# Patient Record
Sex: Male | Born: 1970 | Race: White | Hispanic: No | Marital: Married | Smoking: Current every day smoker
Health system: Southern US, Community
[De-identification: ages and names within clinical notes are randomized; demographics above are authoritative.]

## PROBLEM LIST (undated history)

## (undated) DIAGNOSIS — N200 Calculus of kidney: Secondary | ICD-10-CM

## (undated) HISTORY — PX: CYSTOSCOPY: SUR368

## (undated) HISTORY — PX: KNEE SURGERY: SHX244

## (undated) HISTORY — PX: SHOULDER SURGERY: SHX246

---

## 2010-06-01 ENCOUNTER — Emergency Department (HOSPITAL_COMMUNITY): Payer: Self-pay

## 2010-06-01 ENCOUNTER — Emergency Department (HOSPITAL_COMMUNITY)
Admission: EM | Admit: 2010-06-01 | Discharge: 2010-06-01 | Disposition: A | Discharge status: Home / Self Care | Payer: Self-pay | Attending: Emergency Medicine | Admitting: Emergency Medicine

## 2010-06-01 DIAGNOSIS — R0789 Other chest pain: Secondary | ICD-10-CM | POA: Insufficient documentation

## 2010-06-01 DIAGNOSIS — K219 Gastro-esophageal reflux disease without esophagitis: Secondary | ICD-10-CM | POA: Insufficient documentation

## 2010-06-01 LAB — POCT CARDIAC MARKERS
CKMB, poc: 1.8 ng/mL (ref 1.0–8.0)
Myoglobin, poc: 116 ng/mL (ref 12–200)
Myoglobin, poc: 90.2 ng/mL (ref 12–200)

## 2010-06-01 LAB — BASIC METABOLIC PANEL
Calcium: 9.5 mg/dL (ref 8.4–10.5)
Creatinine, Ser: 1.29 mg/dL (ref 0.4–1.5)
GFR calc non Af Amer: >60 mL/min (ref >60)
Glucose, Bld: 90 mg/dL (ref 70–99)
Sodium: 141 mEq/L (ref 135–145)

## 2010-06-01 LAB — HEPATIC FUNCTION PANEL
AST: 56 U/L — ABNORMAL HIGH (ref 0–37)
Bilirubin, Direct: 0.4 mg/dL — ABNORMAL HIGH (ref 0.0–0.3)
Indirect Bilirubin: 2.6 mg/dL — ABNORMAL HIGH (ref 0.3–0.9)

## 2010-06-01 LAB — DIFFERENTIAL
Basophils Absolute: 0 10*3/uL (ref 0.0–0.1)
Eosinophils Relative: 1 % (ref 0–5)
Lymphocytes Relative: 17 % (ref 12–46)
Neutro Abs: 8.5 10*3/uL — ABNORMAL HIGH (ref 1.7–7.7)

## 2010-06-01 LAB — CBC
HCT: 44.6 % (ref 39.0–52.0)
Hemoglobin: 15.1 g/dL (ref 13.0–17.0)
RDW: 13.8 % (ref 11.5–15.5)
WBC: 10.5 10*3/uL (ref 4.0–10.5)

## 2010-06-01 LAB — LIPASE, BLOOD: Lipase: 27 U/L (ref 11–59)

## 2012-10-05 ENCOUNTER — Encounter (HOSPITAL_BASED_OUTPATIENT_CLINIC_OR_DEPARTMENT_OTHER): Payer: Self-pay

## 2012-10-05 ENCOUNTER — Emergency Department (HOSPITAL_BASED_OUTPATIENT_CLINIC_OR_DEPARTMENT_OTHER)
Admission: EM | Admit: 2012-10-05 | Discharge: 2012-10-05 | Disposition: A | Discharge status: Home / Self Care | Payer: Self-pay | Attending: Emergency Medicine | Admitting: Emergency Medicine

## 2012-10-05 ENCOUNTER — Emergency Department (HOSPITAL_BASED_OUTPATIENT_CLINIC_OR_DEPARTMENT_OTHER): Payer: Self-pay

## 2012-10-05 DIAGNOSIS — Y9389 Activity, other specified: Secondary | ICD-10-CM | POA: Insufficient documentation

## 2012-10-05 DIAGNOSIS — Z87442 Personal history of urinary calculi: Secondary | ICD-10-CM | POA: Insufficient documentation

## 2012-10-05 DIAGNOSIS — W19XXXA Unspecified fall, initial encounter: Secondary | ICD-10-CM

## 2012-10-05 DIAGNOSIS — S40021A Contusion of right upper arm, initial encounter: Secondary | ICD-10-CM

## 2012-10-05 DIAGNOSIS — S5010XA Contusion of unspecified forearm, initial encounter: Secondary | ICD-10-CM | POA: Insufficient documentation

## 2012-10-05 DIAGNOSIS — W138XXA Fall from, out of or through other building or structure, initial encounter: Secondary | ICD-10-CM | POA: Insufficient documentation

## 2012-10-05 DIAGNOSIS — Y9289 Other specified places as the place of occurrence of the external cause: Secondary | ICD-10-CM | POA: Insufficient documentation

## 2012-10-05 DIAGNOSIS — F17200 Nicotine dependence, unspecified, uncomplicated: Secondary | ICD-10-CM | POA: Insufficient documentation

## 2012-10-05 DIAGNOSIS — Z23 Encounter for immunization: Secondary | ICD-10-CM | POA: Insufficient documentation

## 2012-10-05 HISTORY — DX: Calculus of kidney: N20.0

## 2012-10-05 MED ORDER — TETANUS-DIPHTH-ACELL PERTUSSIS 5-2.5-18.5 LF-MCG/0.5 IM SUSP
0.5000 mL | Freq: Once | INTRAMUSCULAR | Status: AC
  Administered 2012-10-05: 0.5 mL via INTRAMUSCULAR
  Filled 2012-10-05: qty 0.5

## 2012-10-05 MED ORDER — HYDROCODONE-ACETAMINOPHEN 5-325 MG PO TABS: 1.0000 | ORAL_TABLET | Freq: Four times a day (QID) | ORAL | Status: DC | PRN

## 2012-10-05 NOTE — ED Notes (Signed)
Pt directed to pharmacy for prescription pick up- d/c with ride- ice pack given for home use

## 2012-10-05 NOTE — ED Notes (Signed)
Patient transported to X-ray 

## 2012-10-05 NOTE — ED Provider Notes (Signed)
History    CSN: [id number] Arrival date & time 10/05/12  1058  First MD Initiated Contact with Patient 10/05/12 1123     Chief Complaint  Patient presents with  . Fall  . Arm Injury   (Consider location/radiation/quality/duration/timing/severity/associated sxs/prior Treatment) Patient is a 42 y.o. male presenting with fall and arm injury. The history is provided by the patient.  Fall This is a new problem. The current episode started less than 1 hour ago. The problem occurs constantly. The problem has not changed since onset.Pertinent negatives include no headaches. Associated symptoms comments: Was standing in an attic and the ceiling gave way and he fell through the ceiling.  His arms slowed  Him down but scraped the inner portion of his right arm and the left hand.  Persistent pain in those areas.  Denies abd/chest pain.  No lumbar or c-spine tenderness.  No head injury or loc.. The symptoms are aggravated by bending and twisting. The symptoms are relieved by rest. He has tried rest for the symptoms. The treatment provided mild relief.  Arm Injury Location:  Arm Time since incident:  1 hour Injury: yes   Mechanism of injury: fall   Fall:    Fall occurred: through a ceiling.   Height of fall:  34ft   Impact surface:  Hard floor   Point of impact:  Feet   Entrapped after fall: no   Arm location:  R upper arm Pain details:    Quality:  Shooting and throbbing   Radiates to:  Does not radiate   Severity:  Moderate   Onset quality:  Sudden   Timing:  Constant   Progression:  Worsening Chronicity:  New Handedness:  Right-handed Dislocation: no   Foreign body present:  No foreign bodies Tetanus status:  Out of date Prior injury to area:  No Worsened by:  Movement and stretching area Ineffective treatments:  None tried Associated symptoms: swelling   Associated symptoms: no back pain, no muscle weakness, no neck pain, no numbness and no tingling    Past Medical History    Diagnosis Date  . Kidney stones    Past Surgical History  Procedure Laterality Date  . Cystoscopy    . Knee surgery    . Shoulder surgery     No family history on file. History  Substance Use Topics  . Smoking status: Current Every Day Smoker -- 0.50 packs/day    Types: Cigarettes  . Smokeless tobacco: Not on file  . Alcohol Use: Yes     Comment: 2 x week    Review of Systems  HENT: Negative for neck pain and neck stiffness.   Musculoskeletal: Negative for back pain.  Neurological: Negative for syncope and headaches.  All other systems reviewed and are negative.    Allergies  Review of patient's allergies indicates no known allergies.  Home Medications   Current Outpatient Rx  Name  Route  Sig  Dispense  Refill  . HYDROcodone-acetaminophen (NORCO/VICODIN) 5-325 MG per tablet   Oral   Take 1-2 tablets by mouth every 6 (six) hours as needed for pain.   6 tablet   0    BP 122/82  Pulse 70  Temp(Src) 97.7 F (36.5 C) (Oral)  Resp 18  Ht 6' (1.829 m)  Wt 225 lb (102.059 kg)  BMI 30.51 kg/m2  SpO2 100% Physical Exam  Nursing note and vitals reviewed. Constitutional: He is oriented to person, place, and time. He appears well-developed and well-nourished. No  distress.  HENT:  Head: Normocephalic and atraumatic.  Eyes: EOM are normal. Pupils are equal, round, and reactive to light.  Neck: No spinous process tenderness and no muscular tenderness present.  Cardiovascular: Normal rate, normal heart sounds and intact distal pulses.   No murmur heard. Pulmonary/Chest: Effort normal and breath sounds normal. He has no wheezes. He exhibits no tenderness.  Abdominal: Soft. He exhibits no distension. There is no tenderness. There is no rebound and no guarding.  Musculoskeletal:       Right shoulder: Normal.       Right elbow: Normal.      Right upper arm: He exhibits tenderness and swelling. He exhibits no bony tenderness.       Arms:      Left hand: He exhibits  tenderness. He exhibits no bony tenderness, no deformity and no swelling. Normal sensation noted. Normal strength noted.       Hands: 2+ radial pulse and normal strenght in the right hand  Neurological: He is alert and oriented to person, place, and time. He has normal strength. No sensory deficit.  Skin: Skin is warm and dry.    ED Course  Procedures (including critical care time) Labs Reviewed - No data to display Dg Forearm Right  10/05/2012   *RADIOLOGY REPORT*  Clinical Data: Right forearm injury.  The patient fell through the attic floor and struck the forearm on a wooden beam.  RIGHT FOREARM - 2 VIEW  Comparison: None.  Findings: No evidence of acute involving the radius or ulna. Distal radioulnar joint intact.  No evidence of dislocation at the elbow.  Well-preserved bone mineral density.  No intrinsic osseous abnormalities.  Visualized wrist joint and elbow joint intact.  IMPRESSION: Normal examination.   Original Report Authenticated By: Hulan Saas, M.D.   Dg Humerus Right  10/05/2012   *RADIOLOGY REPORT*  Clinical Data: Injury to the right upper arm.  The patient fell through the attic floor and struck the right upper arm on wooden beam.  RIGHT HUMERUS - 2+ VIEW  Comparison: None.  Findings: No evidence of acute fracture involving the humerus. Visualized shoulder joint elbow joint intact.  Focal cortical thickening involving the proximal metadiaphysis likely a tug lesion at a musculotendinous origin or insertion.  IMPRESSION: No acute or significant abnormality.   Original Report Authenticated By: Hulan Saas, M.D.   1. Arm contusion, right, initial encounter   2. Fall, initial encounter     MDM   Patient with a fall approximately 9 feet from an attic through the ceiling. He states that he caught himself with his arms and scraped the inside of his arms. No head injury or LOC. He landed on his feet but is able to play without difficulty and denies any back, abdominal or chest  pain. He has no neck pain has a GCS of 15 on exam. Patient has extensive contusion, hematoma to the medial or volar portion of the right arm from axilla to elbow with ecchymosis, tenderness and abrasion.  N/V intact with good pulse, strength and sensation.  No signs of bicep rupture.  Also mild tenderness in the left hand but no deformity or swelling.  Tetanus updated here. Plain films neg and will d/c home with severe contusion.  Gwyneth Sprout, MD 10/05/12 1426

## 2012-10-05 NOTE — ED Notes (Signed)
Fell approximately 8 feet from attic- Injury to right arm

## 2013-02-20 ENCOUNTER — Encounter (HOSPITAL_BASED_OUTPATIENT_CLINIC_OR_DEPARTMENT_OTHER): Payer: Self-pay | Admitting: Emergency Medicine

## 2013-02-20 ENCOUNTER — Emergency Department (HOSPITAL_BASED_OUTPATIENT_CLINIC_OR_DEPARTMENT_OTHER): Payer: Self-pay

## 2013-02-20 ENCOUNTER — Emergency Department (HOSPITAL_BASED_OUTPATIENT_CLINIC_OR_DEPARTMENT_OTHER)
Admission: EM | Admit: 2013-02-20 | Discharge: 2013-02-21 | Disposition: A | Discharge status: Home / Self Care | Payer: Self-pay | Attending: Emergency Medicine | Admitting: Emergency Medicine

## 2013-02-20 DIAGNOSIS — R0789 Other chest pain: Secondary | ICD-10-CM

## 2013-02-20 DIAGNOSIS — R071 Chest pain on breathing: Secondary | ICD-10-CM | POA: Insufficient documentation

## 2013-02-20 DIAGNOSIS — Z87442 Personal history of urinary calculi: Secondary | ICD-10-CM | POA: Insufficient documentation

## 2013-02-20 DIAGNOSIS — R911 Solitary pulmonary nodule: Secondary | ICD-10-CM

## 2013-02-20 DIAGNOSIS — R0602 Shortness of breath: Secondary | ICD-10-CM | POA: Insufficient documentation

## 2013-02-20 DIAGNOSIS — F17200 Nicotine dependence, unspecified, uncomplicated: Secondary | ICD-10-CM | POA: Insufficient documentation

## 2013-02-20 LAB — URINALYSIS, ROUTINE W REFLEX MICROSCOPIC
Bilirubin Urine: NEGATIVE
Glucose, UA: NEGATIVE mg/dL
Hgb urine dipstick: NEGATIVE
Ketones, ur: NEGATIVE mg/dL
Protein, ur: NEGATIVE mg/dL

## 2013-02-20 MED ORDER — SODIUM CHLORIDE 0.9 % IV BOLUS (SEPSIS)
500.0000 mL | Freq: Once | INTRAVENOUS | Status: AC
  Administered 2013-02-20: 500 mL via INTRAVENOUS

## 2013-02-20 MED ORDER — ONDANSETRON HCL 4 MG/2ML IJ SOLN
4.0000 mg | Freq: Once | INTRAMUSCULAR | Status: AC
  Administered 2013-02-20: 4 mg via INTRAVENOUS
  Filled 2013-02-20: qty 2

## 2013-02-20 MED ORDER — SODIUM CHLORIDE 0.9 % IV SOLN: INTRAVENOUS | Status: DC

## 2013-02-20 MED ORDER — HYDROMORPHONE HCL PF 1 MG/ML IJ SOLN
1.0000 mg | Freq: Once | INTRAMUSCULAR | Status: AC
  Administered 2013-02-20: 1 mg via INTRAVENOUS
  Filled 2013-02-20: qty 1

## 2013-02-20 NOTE — ED Notes (Signed)
Patient transported to X-ray via stretcher per tech. 

## 2013-02-20 NOTE — ED Notes (Signed)
Pt has had intermittant pain since wed past that has worsened and is now constant to the right flank and rib/ back area

## 2013-02-20 NOTE — ED Provider Notes (Signed)
CSN: [id number]     Arrival date & time 02/20/13  1933 History   This chart was scribed for Francisco Jakes, MD by Dorothey Baseman, ED Scribe. This patient was seen in room MH09/MH09 and the patient's care was started at 10:17 PM.    Chief Complaint  Patient presents with  . Flank Pain   Patient is a 42 y.o. male presenting with flank pain. The history is provided by the patient. No language interpreter was used.  Flank Pain This is a new problem. The current episode started more than 2 days ago. The problem occurs constantly. The problem has been gradually worsening. Associated symptoms include chest pain and shortness of breath. Pertinent negatives include no abdominal pain and no headaches. The symptoms are aggravated by coughing.   HPI Comments: Francisco Rivera is a 42 y.o. male with a history of kidney stones who presents to the Emergency Department complaining of a sore pain to the posterior, lateral chest wall that began as intermittent onset 4 days ago that has been progressively worsening to a constant pain, 7/10 currently, that is exacerbated with cough. Patient reports that the pain radiates from the right side of the mid back through the right ribs. He states that his current symptoms do not feel similar to his past kidney stones. He reports some associated shortness of breath secondary to pain, He denies abdominal pain, nausea, emesis, fever, chills, hematuria, cough, rhinorrhea, sore throat, visual disturbance, neck pain, leg swelling, rash, headache, or history of bleeding easily. He denies any other pertinent medical history.   Past Medical History  Diagnosis Date  . Kidney stones    Past Surgical History  Procedure Laterality Date  . Cystoscopy    . Knee surgery    . Shoulder surgery     History reviewed. No pertinent family history. History  Substance Use Topics  . Smoking status: Current Every Day Smoker -- 0.50 packs/day    Types: Cigarettes  . Smokeless tobacco: Not on  file  . Alcohol Use: Yes     Comment: 2 x week    Review of Systems  Constitutional: Negative for fever and chills.  HENT: Negative for rhinorrhea and sore throat.   Eyes: Negative for visual disturbance.  Respiratory: Positive for shortness of breath. Negative for cough.   Cardiovascular: Positive for chest pain.  Gastrointestinal: Negative for nausea, vomiting and abdominal pain.  Genitourinary: Positive for flank pain. Negative for hematuria.  Musculoskeletal: Positive for back pain. Negative for neck pain.  Skin: Negative for rash.  Neurological: Negative for headaches.  Hematological: Does not bruise/bleed easily.  Psychiatric/Behavioral: Negative for confusion.    Allergies  Review of patient's allergies indicates no known allergies.  Home Medications   Current Outpatient Rx  Name  Route  Sig  Dispense  Refill  . HYDROcodone-acetaminophen (NORCO/VICODIN) 5-325 MG per tablet   Oral   Take 1-2 tablets by mouth every 6 (six) hours as needed for pain.   6 tablet   0    Triage Vitals: BP 119/81  Pulse 84  Temp(Src) 98.3 F (36.8 C) (Oral)  Resp 18  Wt 225 lb (102.059 kg)  SpO2 99%  Physical Exam  Nursing note and vitals reviewed. Constitutional: He is oriented to person, place, and time. He appears well-developed and well-nourished. No distress.  HENT:  Head: Normocephalic and atraumatic.  Mouth/Throat: Oropharynx is clear and moist.  Eyes: Conjunctivae and EOM are normal.  Neck: Normal range of motion. Neck supple.  Cardiovascular:  Normal rate, regular rhythm and normal heart sounds.  Exam reveals no gallop and no friction rub.   No murmur heard. Pulmonary/Chest: Effort normal and breath sounds normal. No respiratory distress. He has no wheezes. He has no rales.  Abdominal: Soft. Bowel sounds are normal. He exhibits no distension. There is no tenderness.  Musculoskeletal: Normal range of motion. He exhibits no edema.  Neurological: He is alert and oriented to  person, place, and time. No cranial nerve deficit. He exhibits normal muscle tone. Coordination normal.  Skin: Skin is warm and dry.  Psychiatric: He has a normal mood and affect. His behavior is normal.    ED Course  Procedures (including critical care time)  DIAGNOSTIC STUDIES: Oxygen Saturation is 99% on room air, normal by my interpretation.    COORDINATION OF CARE: 10:22 PM- Will order a chest x-ray, UA, and blood labs. Discussed treatment plan with patient at bedside and patient verbalized agreement.     Labs Review Labs Reviewed  URINALYSIS, ROUTINE W REFLEX MICROSCOPIC  D-DIMER, QUANTITATIVE  CBC WITH DIFFERENTIAL  COMPREHENSIVE METABOLIC PANEL  LIPASE, BLOOD   Results for orders placed during the hospital encounter of 02/20/13  URINALYSIS, ROUTINE W REFLEX MICROSCOPIC      Result Value Range   Color, Urine YELLOW  YELLOW   APPearance CLEAR  CLEAR   Specific Gravity, Urine 1.018  1.005 - 1.030   pH 5.5  5.0 - 8.0   Glucose, UA NEGATIVE  NEGATIVE mg/dL   Hgb urine dipstick NEGATIVE  NEGATIVE   Bilirubin Urine NEGATIVE  NEGATIVE   Ketones, ur NEGATIVE  NEGATIVE mg/dL   Protein, ur NEGATIVE  NEGATIVE mg/dL   Urobilinogen, UA 0.2  0.0 - 1.0 mg/dL   Nitrite NEGATIVE  NEGATIVE   Leukocytes, UA NEGATIVE  NEGATIVE  D-DIMER, QUANTITATIVE      Result Value Range   D-Dimer, Quant <0.27  0.00 - 0.48 ug/mL-FEU    Imaging Review Dg Chest 2 View  02/20/2013   CLINICAL DATA:  Right chest pain.  EXAM: CHEST  2 VIEW  COMPARISON:  06/01/2010.  FINDINGS: Normal sized heart. Clear lungs. Stable left posterior, inferior BB. Thoracic spine degenerative changes.  IMPRESSION: No acute abnormality.   Electronically Signed   By: Gordan Payment M.D.   On: 02/20/2013 23:16    EKG Interpretation     Ventricular Rate:  57 PR Interval:  174 QRS Duration: 96 QT Interval:  408 QTC Calculation: 397 R Axis:   71 Text Interpretation:  Sinus bradycardia Otherwise normal ECG No significant  change since last tracing            MDM   1. Chest wall pain    Patient with symptoms consistent with chest wall pain. Has had a history of kidney stones. CT of the abdomen is pending to rule out any kidney stone abnormalities. Urinalysis without evidence of hematuria or infection. Patient's d-dimer is negative. Patient's CBC and complete metabolic panel are pending. If all studies are negative for treatment patient is chest wall pain with the pain medication and anti-inflammatory. Patient returned over to the overnight the emergency physician for disposition.  I personally performed the services described in this documentation, which was scribed in my presence. The recorded information has been reviewed and is accurate.      Francisco Jakes, MD 02/21/13 0001

## 2013-02-21 LAB — COMPREHENSIVE METABOLIC PANEL
Albumin: 4 g/dL (ref 3.5–5.2)
Alkaline Phosphatase: 63 U/L (ref 39–117)
BUN: 20 mg/dL (ref 6–23)
Chloride: 102 mEq/L (ref 96–112)
Creatinine, Ser: 1.3 mg/dL (ref 0.50–1.35)
GFR calc Af Amer: 77 mL/min — ABNORMAL LOW (ref >90)
Glucose, Bld: 98 mg/dL (ref 70–99)
Total Bilirubin: 1 mg/dL (ref 0.3–1.2)
Total Protein: 7.7 g/dL (ref 6.0–8.3)

## 2013-02-21 LAB — CBC WITH DIFFERENTIAL/PLATELET
Basophils Relative: 0 % (ref 0–1)
Eosinophils Absolute: 0.3 10*3/uL (ref 0.0–0.7)
HCT: 48 % (ref 39.0–52.0)
Hemoglobin: 16.3 g/dL (ref 13.0–17.0)
Lymphs Abs: 3.1 10*3/uL (ref 0.7–4.0)
MCH: 30 pg (ref 26.0–34.0)
MCHC: 34 g/dL (ref 30.0–36.0)
Monocytes Absolute: 0.6 10*3/uL (ref 0.1–1.0)
Monocytes Relative: 6 % (ref 3–12)
RBC: 5.44 MIL/uL (ref 4.22–5.81)

## 2013-02-21 LAB — LIPASE, BLOOD: Lipase: 43 U/L (ref 11–59)

## 2013-02-21 MED ORDER — KETOROLAC TROMETHAMINE 30 MG/ML IJ SOLN
30.0000 mg | Freq: Once | INTRAMUSCULAR | Status: AC
  Administered 2013-02-21: 30 mg via INTRAVENOUS

## 2013-02-21 MED ORDER — METHOCARBAMOL 500 MG PO TABS
ORAL_TABLET | ORAL | Status: AC
  Filled 2013-02-21: qty 1

## 2013-02-21 MED ORDER — HYDROMORPHONE HCL PF 1 MG/ML IJ SOLN
1.0000 mg | Freq: Once | INTRAMUSCULAR | Status: AC
  Administered 2013-02-21: 1 mg via INTRAVENOUS

## 2013-02-21 MED ORDER — METHOCARBAMOL 500 MG PO TABS: 500.0000 mg | ORAL_TABLET | Freq: Two times a day (BID) | ORAL | Status: DC

## 2013-02-21 MED ORDER — HYDROMORPHONE HCL PF 1 MG/ML IJ SOLN
INTRAMUSCULAR | Status: AC
  Filled 2013-02-21: qty 1

## 2013-02-21 MED ORDER — METHOCARBAMOL 500 MG PO TABS
500.0000 mg | ORAL_TABLET | Freq: Once | ORAL | Status: AC
  Administered 2013-02-21: 500 mg via ORAL

## 2013-02-21 MED ORDER — KETOROLAC TROMETHAMINE 30 MG/ML IJ SOLN
INTRAMUSCULAR | Status: AC
  Filled 2013-02-21: qty 1

## 2013-02-21 MED ORDER — HYDROCODONE-ACETAMINOPHEN 5-325 MG PO TABS: 1.0000 | ORAL_TABLET | ORAL | Status: DC | PRN

## 2013-02-21 NOTE — ED Notes (Signed)
MD at bedside to discuss results of testing. 

## 2013-02-21 NOTE — ED Notes (Signed)
Pt called out requesting more pain meds, md notified.

## 2013-02-21 NOTE — ED Provider Notes (Signed)
Patient's pain is reproducible upon palpation of his right lateral thoracic back. Workup is negative for cause. Likely muscular. Discharge home with pain medication. Patient informed of CT results of pulmonary nodules. I spoken at length with the patient on the need to followup to have CT repeated in 6-12 months. Patient is encouraged to stop smoking.  Loren Racer, MD 02/21/13 [phone]

## 2014-11-17 IMAGING — CT CT ABD-PELV W/O CM
2 of 4 series · 16 of 46 positions shown, 18 images · non-contrast
Comparison: None.

CLINICAL DATA: Right chest and right flank pain. History of
nephrolithiasis. Smoker.

EXAM:
CT ABDOMEN AND PELVIS WITHOUT CONTRAST
TECHNIQUE: Multidetector CT imaging of the abdomen and pelvis was performed
following the standard protocol without intravenous contrast.

[Series 2: renal stone < 200 lbs 5.0 b31f · axial · 0.85mm/px · z∈[-480,-10]mm · 13 of 104 slices shown, 15 images]
[im 5/104  soft-tissue]
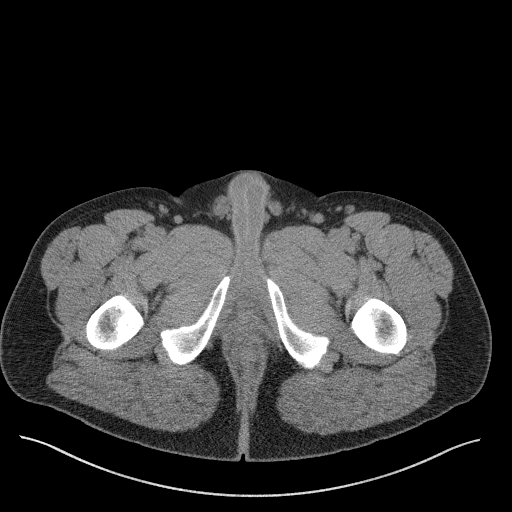
[im 5/104  bone]
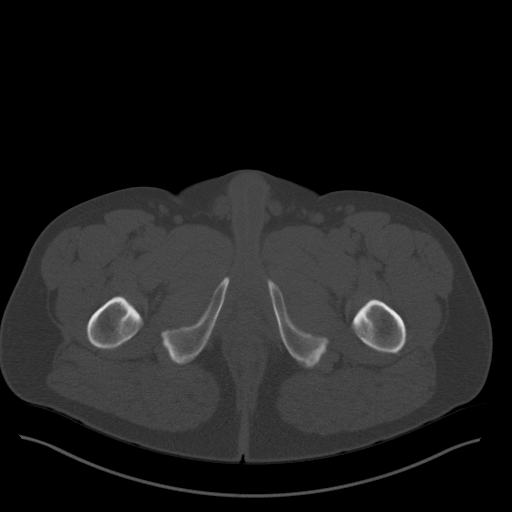
[im 13/104  soft-tissue]
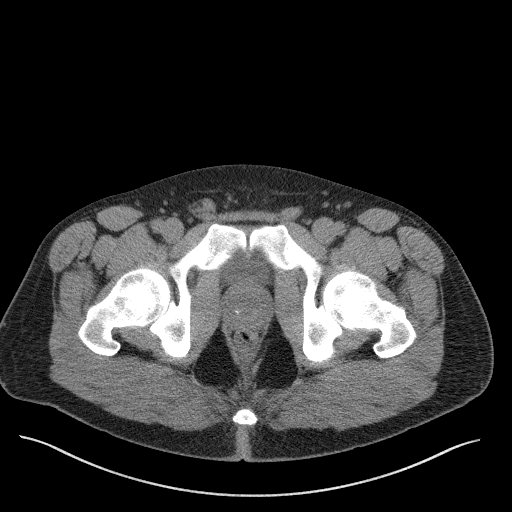
[im 22/104  soft-tissue]
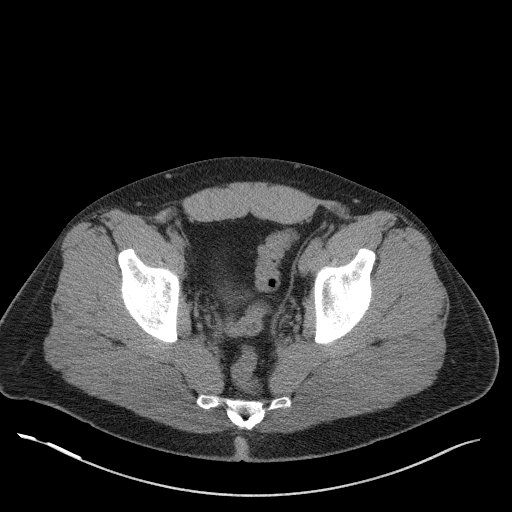
[im 31/104  soft-tissue]
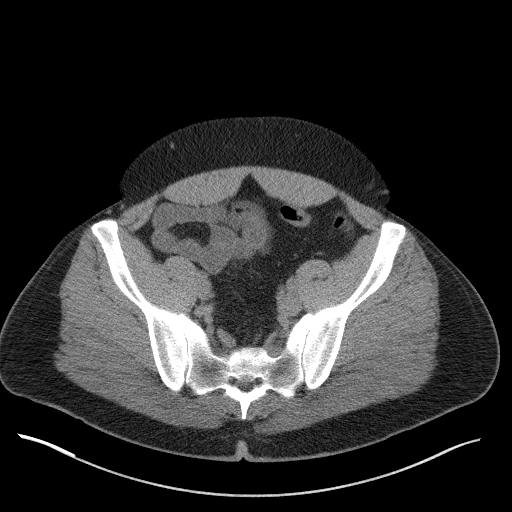
[im 35/104  soft-tissue]
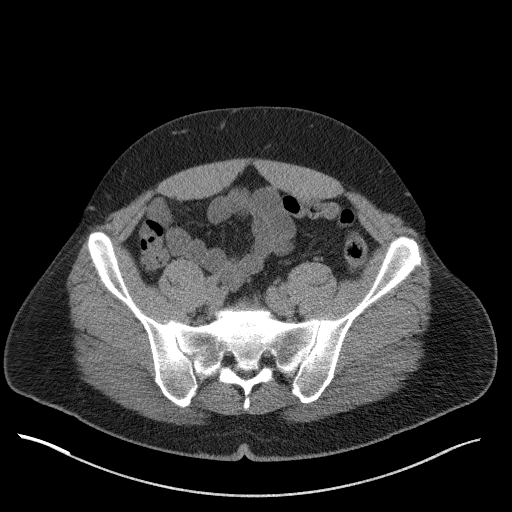
[im 43/104  soft-tissue]
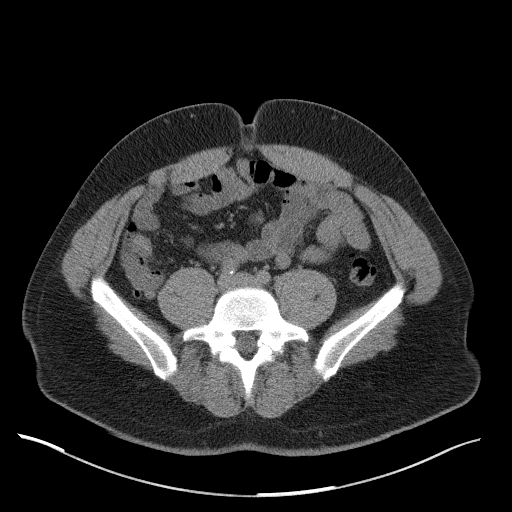
[im 52/104  soft-tissue]
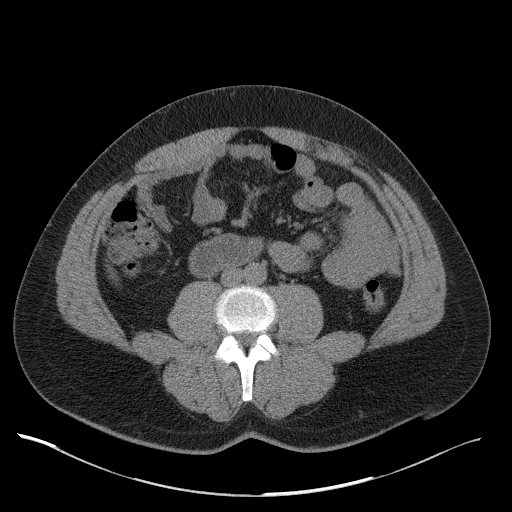
[im 61/104  soft-tissue]
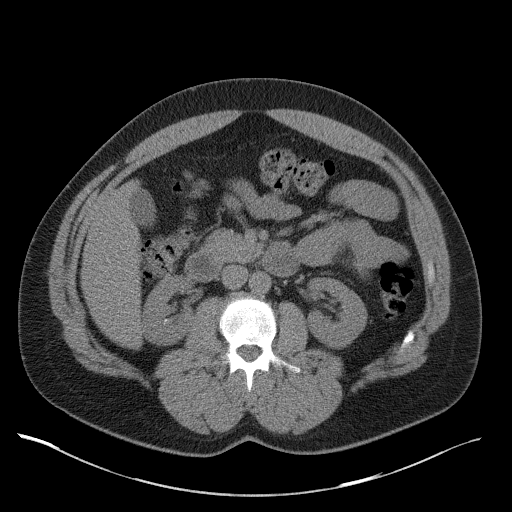
[im 69/104  soft-tissue]
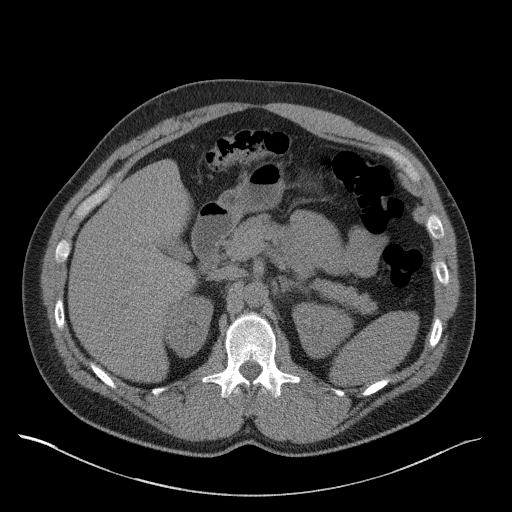
[im 69/104  bone]
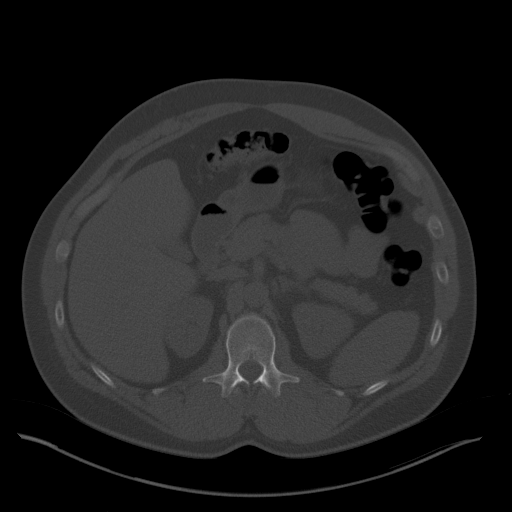
[im 73/104  soft-tissue]
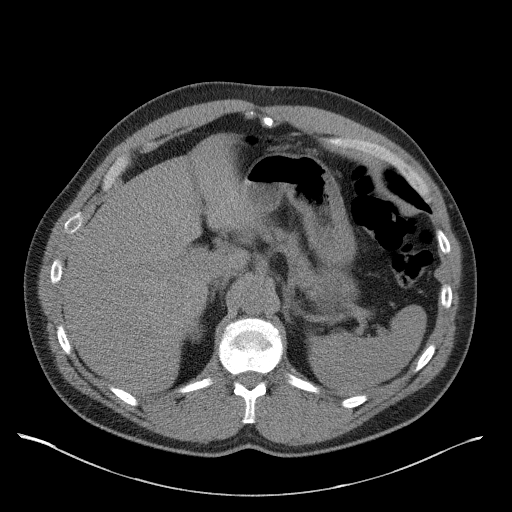
[im 82/104  soft-tissue]
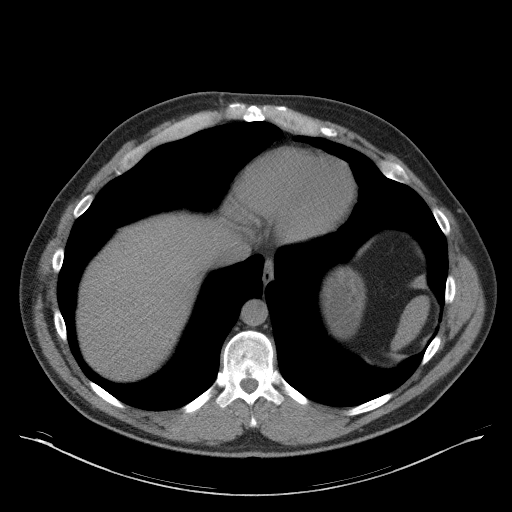
[im 91/104  soft-tissue]
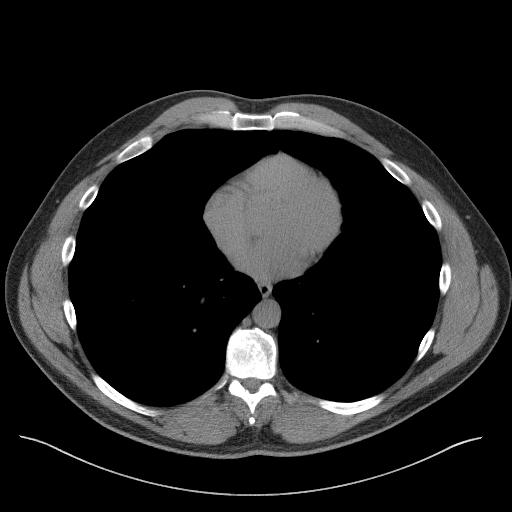
[im 99/104  soft-tissue]
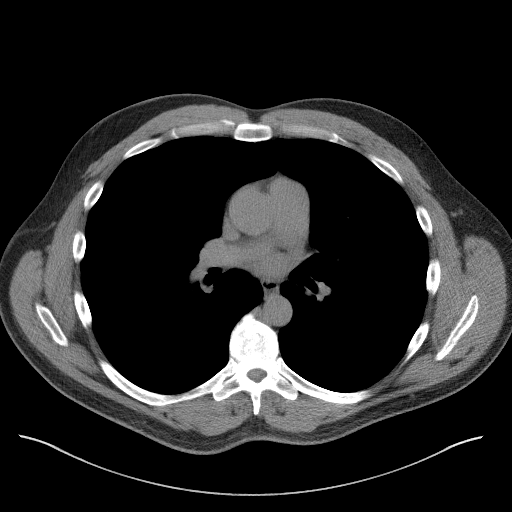

[Series 5: renal stone 3.0 coronal · coronal · 0.81mm/px · 3 of 105 slices shown]
[im 35/105  soft-tissue]
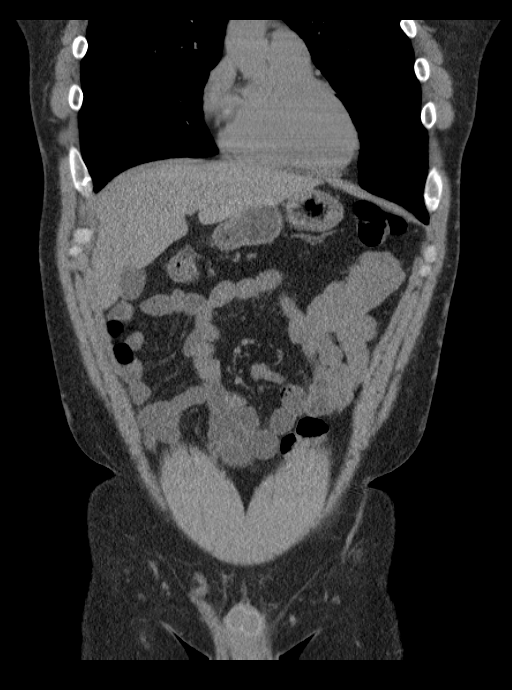
[im 47/105  soft-tissue]
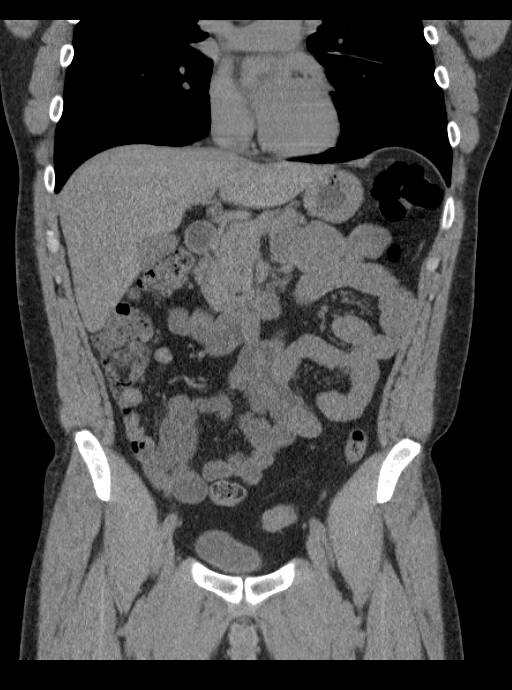
[im 58/105  soft-tissue]
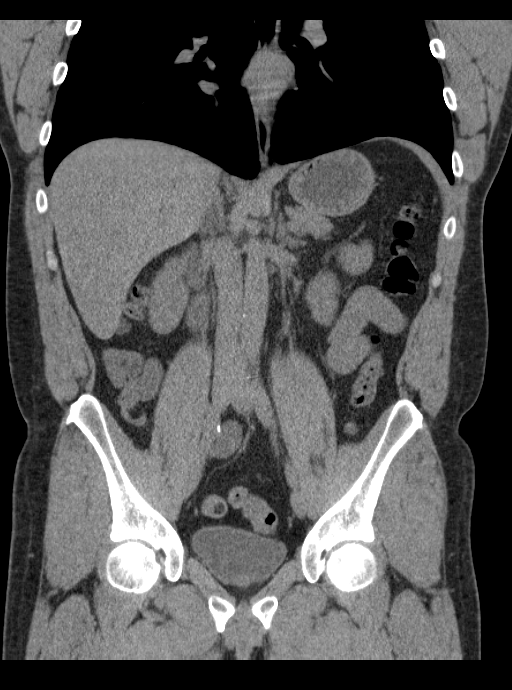

[16 of 46 positions shown; findings below may reference images not displayed]

FINDINGS: Multiple small calculi in both kidneys. These all measure 4 mm or
less in maximum diameter each. No bladder or ureteral calculi and no
hydronephrosis. Normal appearing appendix. Normal non contrasted
appearance of the liver, spleen, pancreas, gallbladder, adrenal
glands and urinary bladder. Minimally enlarged prostate gland with
coarse calcifications. No gastrointestinal abnormalities or enlarged
lymph nodes.

6 mm nodule in the lateral aspect of the right middle lobe on image
12. This is not a calcified. 4 mm nodule in the lateral aspect of
the left lower lobe on image 16. This is also not calcified. There
is also a 6 mm noncalcified nodule in the left lower lobe adjacent
to the left hemidiaphragm on image 19.

Lumbar and lower thoracic spine degenerative changes. Bilateral L5
pars interarticularis defects with grade 1 anterolisthesis at the
L5-S1 level.
IMPRESSION: 1. Multiple small, nonobstructing bilateral renal calculi.
2. Multiple small, noncalcified nodules at both lung bases. Given
risk factors for bronchogenic carcinoma, follow-up chest CT at 6 -
12 months is recommended. This recommendation follows the consensus
statement: Guidelines for Management of Small Pulmonary Nodules
Detected on CT Scans: A Statement from the [HOSPITAL] as
published in Radiology 1443;[DATE].
3. Bilateral L5 spondylolysis with associated grade 1
spondylolisthesis at the L5-S1 level.

## 2020-09-05 ENCOUNTER — Other Ambulatory Visit: Payer: Self-pay

## 2020-09-05 ENCOUNTER — Encounter (HOSPITAL_BASED_OUTPATIENT_CLINIC_OR_DEPARTMENT_OTHER): Payer: Self-pay | Admitting: Emergency Medicine

## 2020-09-05 ENCOUNTER — Emergency Department (HOSPITAL_BASED_OUTPATIENT_CLINIC_OR_DEPARTMENT_OTHER)
Admission: EM | Admit: 2020-09-05 | Discharge: 2020-09-05 | Disposition: A | Discharge status: Home / Self Care | Payer: BLUE CROSS/BLUE SHIELD | Attending: Emergency Medicine | Admitting: Emergency Medicine

## 2020-09-05 DIAGNOSIS — F17210 Nicotine dependence, cigarettes, uncomplicated: Secondary | ICD-10-CM | POA: Insufficient documentation

## 2020-09-05 DIAGNOSIS — M549 Dorsalgia, unspecified: Secondary | ICD-10-CM | POA: Diagnosis present

## 2020-09-05 DIAGNOSIS — M5431 Sciatica, right side: Secondary | ICD-10-CM

## 2020-09-05 DIAGNOSIS — M5441 Lumbago with sciatica, right side: Secondary | ICD-10-CM | POA: Diagnosis not present

## 2020-09-05 DIAGNOSIS — R202 Paresthesia of skin: Secondary | ICD-10-CM | POA: Insufficient documentation

## 2020-09-05 MED ORDER — ONDANSETRON 4 MG PO TBDP
8.0000 mg | ORAL_TABLET | Freq: Once | ORAL | Status: AC
  Administered 2020-09-05: 8 mg via ORAL
  Filled 2020-09-05 (×2): qty 2

## 2020-09-05 MED ORDER — HYDROMORPHONE HCL 2 MG PO TABS: 2.0000 mg | ORAL_TABLET | ORAL | 0 refills | Status: DC | PRN

## 2020-09-05 MED ORDER — HYDROMORPHONE HCL 1 MG/ML IJ SOLN
2.0000 mg | Freq: Once | INTRAMUSCULAR | Status: AC
  Administered 2020-09-05: 2 mg via INTRAMUSCULAR
  Filled 2020-09-05: qty 2

## 2020-09-05 NOTE — ED Triage Notes (Signed)
Patient arrived via [facility] c/o back pain x 3 days. Seen previously at [facility] for same. Patient states being discharged for kidney stones. Patient states pain 10/10 on right side with radiation down right leg. Patient unable to keep still during triage. Patient is AO x 4, VS WDL, unable to stand.

## 2020-09-05 NOTE — ED Provider Notes (Signed)
[facility] Provider Note: Francisco Dell, MD, FACEP  CSN: [id number] MRN: [id number] ARRIVAL: 09/05/20 at 0229 ROOM: MH06/MH06   CHIEF COMPLAINT  Back Pain   HISTORY OF PRESENT ILLNESS  09/05/20 2:40 AM Francisco Rivera is a 50 y.o. male who has had back pain for 3 days.  The pain is located in his right paralumbar region radiating into his right buttock and down the back of his right leg.  He rates it as a 10 out of 10.  It is a sharp stabbing pain.  It is worse with movement of his right hip or with movement of the lower back.  He has no numbness or weakness associated with it apart from paresthesias of the posterior right thigh.  He has had no bowel or bladder changes.  He was seen for this at [facility] on 09/02/2020 and had a CT scan which showed intrarenal stones but no ureteral stone.  He was told he was passing a kidney stone but was told that they do not prescribe narcotics for kidney stones.  He was advised to take ibuprofen.  He is already on diclofenac.  He has attempted to tolerate the pain since but cannot do so.  He is also attempted massages by his wife without relief of the pain.   Past Medical History:  Diagnosis Date  . Kidney stones     Past Surgical History:  Procedure Laterality Date  . CYSTOSCOPY    . KNEE SURGERY    . SHOULDER SURGERY      No family history on file.  Social History   Tobacco Use  . Smoking status: Current Every Day Smoker    Packs/day: 0.50    Types: Cigarettes  . Smokeless tobacco: Never Used  Vaping Use  . Vaping Use: Never used  Substance Use Topics  . Alcohol use: Yes    Comment: 2 x week  . Drug use: No    Prior to Admission medications   Medication Sig Start Date End Date Taking? Authorizing Provider  HYDROmorphone (DILAUDID) 2 MG tablet Take 1 tablet (2 mg total) by mouth every 4 (four) hours as needed for severe pain. 09/05/20  Yes Shalita Notte, MD    Allergies Patient has no known  allergies.   REVIEW OF SYSTEMS  Negative except as noted here or in the History of Present Illness.   PHYSICAL EXAMINATION  Initial Vital Signs Blood pressure (!) 152/99, pulse (!) 55, temperature 98.4 F (36.9 C), temperature source Oral, resp. rate 20, height 6' (1.829 m), weight 108.9 kg, SpO2 100 %.  Examination General: Well-developed, well-nourished male in no acute distress; appearance consistent with age of record HENT: normocephalic; atraumatic Eyes: pupils equal, round and reactive to light; extraocular muscles intact Neck: supple Heart: regular rate and rhythm Lungs: clear to auscultation bilaterally Abdomen: soft; nondistended; nontender; bowel sounds present Back: Right sciatic notch tenderness; pain on passive movement of right hip Extremities: No deformity; limited range of motion of right hip; pulses normal Neurologic: Awake, alert and oriented; motor function intact in all extremities and symmetric; no facial droop; no saddle anesthesia Skin: Warm and dry Psychiatric: Grimacing   RESULTS  Summary of this visit's results, reviewed and interpreted by myself:   EKG Interpretation  Date/Time:    Ventricular Rate:    PR Interval:    QRS Duration:   QT Interval:    QTC Calculation:   R Axis:     Text Interpretation:  Laboratory Studies: No results found for this or any previous visit (from the past 24 hour(s)). Imaging Studies: No results found.  ED COURSE and MDM  Nursing notes, initial and subsequent vitals signs, including pulse oximetry, reviewed and interpreted by myself.  Vitals:   09/05/20 0238  BP: (!) 152/99  Pulse: (!) 55  Resp: 20  Temp: 98.4 F (36.9 C)  TempSrc: Oral  SpO2: 100%  Weight: 108.9 kg  Height: 6' (1.829 m)   Medications  ondansetron (ZOFRAN-ODT) disintegrating tablet 8 mg (8 mg Oral Given 09/05/20 0253)  HYDROmorphone (DILAUDID) injection 2 mg (2 mg Intramuscular Given 09/05/20 0258)    3:42 AM Feels  significantly better after IM Dilaudid.  We will treat with a short course of Dilaudid orally and refer to his PCP.  PROCEDURES  Procedures   ED DIAGNOSES     ICD-10-CM   1. Sciatica of right side  M54.31        Anaily Ashbaugh, MD 09/05/20 [phone]

## 2020-09-16 ENCOUNTER — Encounter (HOSPITAL_BASED_OUTPATIENT_CLINIC_OR_DEPARTMENT_OTHER): Payer: Self-pay | Admitting: Emergency Medicine

## 2020-09-16 ENCOUNTER — Other Ambulatory Visit: Payer: Self-pay

## 2020-09-16 ENCOUNTER — Emergency Department (HOSPITAL_BASED_OUTPATIENT_CLINIC_OR_DEPARTMENT_OTHER)
Admission: EM | Admit: 2020-09-16 | Discharge: 2020-09-16 | Disposition: A | Discharge status: Home / Self Care | Payer: BLUE CROSS/BLUE SHIELD | Attending: Emergency Medicine | Admitting: Emergency Medicine

## 2020-09-16 DIAGNOSIS — F17210 Nicotine dependence, cigarettes, uncomplicated: Secondary | ICD-10-CM | POA: Insufficient documentation

## 2020-09-16 DIAGNOSIS — M5416 Radiculopathy, lumbar region: Secondary | ICD-10-CM | POA: Insufficient documentation

## 2020-09-16 DIAGNOSIS — M545 Low back pain, unspecified: Secondary | ICD-10-CM | POA: Diagnosis present

## 2020-09-16 MED ORDER — DIAZEPAM 5 MG PO TABS
5.0000 mg | ORAL_TABLET | Freq: Once | ORAL | Status: AC
  Administered 2020-09-16: 5 mg via ORAL
  Filled 2020-09-16: qty 1

## 2020-09-16 MED ORDER — HYDROMORPHONE HCL 1 MG/ML IJ SOLN
1.0000 mg | Freq: Once | INTRAMUSCULAR | Status: AC
  Administered 2020-09-16: 1 mg via INTRAVENOUS
  Filled 2020-09-16: qty 1

## 2020-09-16 MED ORDER — ACETAMINOPHEN 500 MG PO TABS
1000.0000 mg | ORAL_TABLET | Freq: Once | ORAL | Status: AC
  Administered 2020-09-16: 1000 mg via ORAL
  Filled 2020-09-16: qty 2

## 2020-09-16 MED ORDER — HYDROMORPHONE HCL 2 MG PO TABS: 2.0000 mg | ORAL_TABLET | ORAL | 0 refills | Status: AC | PRN

## 2020-09-16 MED ORDER — KETOROLAC TROMETHAMINE 30 MG/ML IJ SOLN
15.0000 mg | Freq: Once | INTRAMUSCULAR | Status: AC
  Administered 2020-09-16: 15 mg via INTRAVENOUS
  Filled 2020-09-16: qty 1

## 2020-09-16 NOTE — ED Triage Notes (Signed)
Pt c/o right flank pain that radiates down his right leg. Pt states that he has been seen for same complaint 3 times over the past week and half. Pt states that he is having trouble walking and being able to stand for periods of time.  Pt states that has an appointment to get an injection tomorrow but that the pain had gotten too worse. Pt aaox3, VSS, GCS 15, NAD noted.

## 2020-09-16 NOTE — Discharge Instructions (Addendum)

## 2020-09-16 NOTE — ED Provider Notes (Signed)
[facility] EMERGENCY DEPARTMENT Provider Note   CSN: [id number] Arrival date & time: 09/16/20  0546     History Chief Complaint  Patient presents with  . Back Pain    Francisco Rivera is a 50 y.o. male.  50 yo M with a chief complaints of right-sided low back pain that radiates down the leg.  This is been going on for about a week or so now.  Has been seen at an outside hospital and had CT imaging that showed kidney stones within the kidney and degenerative findings in the back and anterior listhesis of L4 and L5.  The patient since then has been seen at this ED as well as by his family physician.  He is scheduled an appointment to see a pain management doctor to see about having injections performed.  He unfortunately felt like he was unable to make it to that appointment tomorrow because the pain was so severe.  He feels like the leg is somewhat numb and weak but only when he is standing on it.  At baseline he feels like he may be has some numbness to the anterior medial aspect of the right thigh.  Denies any loss of bowel or bladder denies loss of peritoneal sensation denies spinal injection or surgery recently.  Denies history of cancer.  Has been taking Dilaudid tablets at home with some improvement.  Ran out of the tablets a few days ago.  The history is provided by the patient.  Back Pain Location:  Lumbar spine Quality:  Aching and burning Radiates to:  R foot Pain severity:  Moderate Onset quality:  Gradual Duration:  10 days Timing:  Constant Progression:  Worsening Chronicity:  New Relieved by:  Being still Worsened by:  Palpation, movement, bending and twisting Ineffective treatments:  None tried Associated symptoms: no abdominal pain, no chest pain, no fever and no headaches        Past Medical History:  Diagnosis Date  . Kidney stones     There are no problems to display for this patient.   Past Surgical History:  Procedure Laterality Date  .  CYSTOSCOPY    . KNEE SURGERY    . SHOULDER SURGERY         No family history on file.  Social History   Tobacco Use  . Smoking status: Current Every Day Smoker    Packs/day: 0.50    Types: Cigarettes  . Smokeless tobacco: Never Used  Vaping Use  . Vaping Use: Never used  Substance Use Topics  . Alcohol use: Yes    Comment: 2 x week  . Drug use: No    Home Medications Prior to Admission medications   Medication Sig Start Date End Date Taking? Authorizing Provider  HYDROmorphone (DILAUDID) 2 MG tablet Take 1 tablet (2 mg total) by mouth every 4 (four) hours as needed for severe pain. 09/16/20   Melene Plan, DO    Allergies    Patient has no known allergies.  Review of Systems   Review of Systems  Constitutional: Negative for chills and fever.  HENT: Negative for congestion and facial swelling.   Eyes: Negative for discharge and visual disturbance.  Respiratory: Negative for shortness of breath.   Cardiovascular: Negative for chest pain and palpitations.  Gastrointestinal: Negative for abdominal pain, diarrhea and vomiting.  Musculoskeletal: Positive for back pain. Negative for arthralgias and myalgias.  Skin: Negative for color change and rash.  Neurological: Negative for tremors, syncope and headaches.  Psychiatric/Behavioral: Negative for confusion and dysphoric mood.    Physical Exam Updated Vital Signs BP (!) 128/94   Pulse 69   Temp 98.4 F (36.9 C)   Resp 17   Ht 6' (1.829 m)   Wt 108.9 kg   SpO2 99%   BMI 32.55 kg/m   Physical Exam Vitals and nursing note reviewed.  Constitutional:      Appearance: He is well-developed.  HENT:     Head: Normocephalic and atraumatic.  Eyes:     Pupils: Pupils are equal, round, and reactive to light.  Neck:     Vascular: No JVD.  Cardiovascular:     Rate and Rhythm: Normal rate and regular rhythm.     Heart sounds: No murmur heard. No friction rub. No gallop.   Pulmonary:     Effort: No respiratory distress.      Breath sounds: No wheezing.  Abdominal:     General: There is no distension.     Tenderness: There is no guarding or rebound.  Musculoskeletal:        General: Normal range of motion.     Cervical back: Normal range of motion and neck supple.     Comments: Pulse motor and sensation are intact in the foot.  Reflexes are 2+ and equal.  No clonus.  Negative Babinski.  Negative straight leg raise test bilaterally.  Skin:    Coloration: Skin is not pale.     Findings: No rash.  Neurological:     Mental Status: He is alert and oriented to person, place, and time.  Psychiatric:        Behavior: Behavior normal.     ED Results / Procedures / Treatments   Labs (all labs ordered are listed, but only abnormal results are displayed) Labs Reviewed - No data to display  EKG None  Radiology No results found.  Procedures Procedures   Medications Ordered in ED Medications  HYDROmorphone (DILAUDID) injection 1 mg (has no administration in time range)  acetaminophen (TYLENOL) tablet 1,000 mg (has no administration in time range)  ketorolac (TORADOL) 30 MG/ML injection 15 mg (has no administration in time range)  diazepam (VALIUM) tablet 5 mg (has no administration in time range)    ED Course  I have reviewed the triage vital signs and the nursing notes.  Pertinent labs & imaging results that were available during my care of the patient were reviewed by me and considered in my medical decision making (see chart for details).    MDM Rules/Calculators/A&P                          50 yo M with a chief complaints of right-sided low back pain.  Going on for the past 10 days or so.  Has been seen in the ED previously for this as well as been seen at an outside ED and had imaging performed.  No red flags.  We will treat his pain here.  Encouraged him to follow-up with his appointment tomorrow with pain management.  Prescription of pain medicine to get him to his appointment  tomorrow.  6:22 AM:  I have discussed the diagnosis/risks/treatment options with the patient and believe the pt to be eligible for discharge home to follow-up with PCP. We also discussed returning to the ED immediately if new or worsening sx occur. We discussed the sx which are most concerning (e.g., sudden worsening pain, fever, inability to tolerate by mouth,  cauda equina s/sx) that necessitate immediate return. Medications administered to the patient during their visit and any new prescriptions provided to the patient are listed below.  Medications given during this visit Medications  HYDROmorphone (DILAUDID) injection 1 mg (has no administration in time range)  acetaminophen (TYLENOL) tablet 1,000 mg (has no administration in time range)  ketorolac (TORADOL) 30 MG/ML injection 15 mg (has no administration in time range)  diazepam (VALIUM) tablet 5 mg (has no administration in time range)     The patient appears reasonably screen and/or stabilized for discharge and I doubt any other medical condition or other [facility] requiring further screening, evaluation, or treatment in the ED at this time prior to discharge.   Final Clinical Impression(s) / ED Diagnoses Final diagnoses:  Lumbar radiculopathy    Rx / DC Orders ED Discharge Orders         Ordered    HYDROmorphone (DILAUDID) 2 MG tablet  Every 4 hours PRN        09/16/20 0617           Melene Plan, DO 09/16/20 0622

## 2022-04-09 ENCOUNTER — Other Ambulatory Visit: Payer: Self-pay

## 2022-04-09 DIAGNOSIS — F17200 Nicotine dependence, unspecified, uncomplicated: Secondary | ICD-10-CM | POA: Insufficient documentation

## 2022-04-09 DIAGNOSIS — M545 Low back pain, unspecified: Secondary | ICD-10-CM | POA: Diagnosis present

## 2022-04-09 DIAGNOSIS — M5442 Lumbago with sciatica, left side: Secondary | ICD-10-CM | POA: Diagnosis not present

## 2022-04-09 DIAGNOSIS — R109 Unspecified abdominal pain: Secondary | ICD-10-CM | POA: Diagnosis not present

## 2022-04-09 NOTE — ED Triage Notes (Signed)
BIB ems- Right sided back pain radiates down right leg and into right testicle. Denies any injury. States back has been spasming for several days and would get better but tonight the pain has not let up.

## 2022-04-10 ENCOUNTER — Emergency Department (HOSPITAL_BASED_OUTPATIENT_CLINIC_OR_DEPARTMENT_OTHER): Payer: BLUE CROSS/BLUE SHIELD

## 2022-04-10 ENCOUNTER — Emergency Department (HOSPITAL_BASED_OUTPATIENT_CLINIC_OR_DEPARTMENT_OTHER)
Admission: EM | Admit: 2022-04-10 | Discharge: 2022-04-10 | Disposition: A | Discharge status: Home / Self Care | Payer: BLUE CROSS/BLUE SHIELD | Attending: Emergency Medicine | Admitting: Emergency Medicine

## 2022-04-10 DIAGNOSIS — M5442 Lumbago with sciatica, left side: Secondary | ICD-10-CM

## 2022-04-10 LAB — URINALYSIS, ROUTINE W REFLEX MICROSCOPIC
Bilirubin Urine: NEGATIVE
Glucose, UA: NEGATIVE mg/dL
Hgb urine dipstick: NEGATIVE
Ketones, ur: NEGATIVE mg/dL
Leukocytes,Ua: NEGATIVE
Nitrite: NEGATIVE
Protein, ur: NEGATIVE mg/dL
Specific Gravity, Urine: 1.025 (ref 1.005–1.030)
pH: 5.5 (ref 5.0–8.0)

## 2022-04-10 MED ORDER — SENNOSIDES-DOCUSATE SODIUM 8.6-50 MG PO TABS: 1.0000 | ORAL_TABLET | Freq: Every evening | ORAL | 0 refills | Status: AC | PRN

## 2022-04-10 MED ORDER — DIAZEPAM 5 MG/ML IJ SOLN
5.0000 mg | Freq: Once | INTRAMUSCULAR | Status: AC
  Administered 2022-04-10: 5 mg via INTRAMUSCULAR
  Filled 2022-04-10: qty 2

## 2022-04-10 MED ORDER — OXYCODONE-ACETAMINOPHEN 5-325 MG PO TABS: 1.0000 | ORAL_TABLET | Freq: Four times a day (QID) | ORAL | 0 refills | Status: AC | PRN

## 2022-04-10 MED ORDER — LIDOCAINE 5 % EX OINT: 1.0000 | TOPICAL_OINTMENT | CUTANEOUS | 0 refills | Status: AC | PRN

## 2022-04-10 MED ORDER — DEXAMETHASONE SODIUM PHOSPHATE 10 MG/ML IJ SOLN
10.0000 mg | Freq: Once | INTRAMUSCULAR | Status: AC
  Administered 2022-04-10: 10 mg via INTRAMUSCULAR
  Filled 2022-04-10: qty 1

## 2022-04-10 MED ORDER — KETOROLAC TROMETHAMINE 30 MG/ML IJ SOLN
30.0000 mg | Freq: Once | INTRAMUSCULAR | Status: AC
  Administered 2022-04-10: 30 mg via INTRAMUSCULAR
  Filled 2022-04-10: qty 1

## 2022-04-10 NOTE — ED Provider Notes (Signed)
Emergency Department Provider Note   I have reviewed the triage vital signs and the nursing notes.   HISTORY  Chief Complaint Back Pain   HPI Francisco Rivera is a 51 y.o. male with prior history of kidney stones as well as sciatica/back discomfort presents to the emergency department with right-sided pain radiating from the right hip to the right thigh and down the right leg.  He notes some soreness in the right testicle but denies any pain with palpation to the area.  No injuries.  Denies any numbness or weakness.  States has had similar pain with kidney stones in the past as well as lower back pain which she states required surgery.  He is not having any groin numbness.  No bowel or bladder incontinence.  No fevers or chills.  He is not an IV drug user.   Past Medical History:  Diagnosis Date   Kidney stones     Review of Systems  Constitutional: No fever/chills Eyes: No visual changes. ENT: No sore throat. Cardiovascular: Denies chest pain. Respiratory: Denies shortness of breath. Gastrointestinal: No abdominal pain.  No nausea, no vomiting.  No diarrhea.  No constipation. Genitourinary: Negative for dysuria. Musculoskeletal: Positive for back pain. Skin: Negative for rash. Neurological: Negative for headaches, focal weakness or numbness.  ____________________________________________   PHYSICAL EXAM:  VITAL SIGNS: ED Triage Vitals  Enc Vitals Group     BP 04/09/22 2239 103/81     Pulse Rate 04/09/22 2239 98     Resp 04/09/22 2239 (!) 22     Temp 04/09/22 2239 97.9 F (36.6 C)     Temp Source 04/09/22 2239 Oral     SpO2 04/09/22 2239 99 %     Weight 04/09/22 2245 230 lb (104.3 kg)     Height 04/09/22 2245 5\' 11"  (1.803 m)   Constitutional: Alert and oriented. Appears uncomfortable but able to provide a full history.  Eyes: Conjunctivae are normal.  Head: Atraumatic. Nose: No congestion/rhinnorhea. Mouth/Throat: Mucous membranes are moist.  Neck: No stridor.   Cardiovascular: Normal rate, regular rhythm. Good peripheral circulation. Grossly normal heart sounds.   Respiratory: Normal respiratory effort.  No retractions. Lungs CTAB. Gastrointestinal: Soft and nontender. No distention.  Musculoskeletal: No lower extremity tenderness nor edema. No gross deformities of extremities. Neurologic:  Normal speech and language. No gross focal neurologic deficits are appreciated.  Skin:  Skin is warm, dry and intact. No rash noted.  ____________________________________________   LABS (all labs ordered are listed, but only abnormal results are displayed)  Labs Reviewed  URINE CULTURE  URINALYSIS, ROUTINE W REFLEX MICROSCOPIC   ____________________________________________   PROCEDURES  Procedure(s) performed:   Procedures  None  ____________________________________________   INITIAL IMPRESSION / ASSESSMENT AND PLAN / ED COURSE  Pertinent labs & imaging results that were available during my care of the patient were reviewed by me and considered in my medical decision making (see chart for details).   This patient is Presenting for Evaluation of back pain, which does require a range of treatment options, and is a complaint that involves a high risk of morbidity and mortality.  The Differential Diagnoses includes but is not exclusive to musculoskeletal back pain, renal colic, urinary tract infection, pyelonephritis, intra-abdominal causes of back pain, aortic aneurysm or dissection, cauda equina syndrome, sciatica, lumbar disc disease, thoracic disc disease, etc.   Critical Interventions-    Medications  ketorolac (TORADOL) 30 MG/ML injection 30 mg (30 mg Intramuscular Given 04/10/22 0102)  diazepam (VALIUM)  injection 5 mg (5 mg Intramuscular Given 04/10/22 0102)  dexamethasone (DECADRON) injection 10 mg (10 mg Intramuscular Given 04/10/22 0413)    Reassessment after intervention: Pain improved and patient is ambulatory.    Clinical  Laboratory Tests Ordered, included UA without infection or RBC.  Radiologic Tests Ordered, included CT renal. I independently interpreted the images and agree with radiology interpretation.   Cardiac Monitor Tracing which shows NSR.   Social Determinants of Health Risk patient is a smoker.   Medical Decision Making: Summary:  Patient presents to the ED for evaluation of pain which seems most consistent with sciatica.  CT renal without hydronephrosis or other obstructing stone.  No acute bony abnormality.  Patient's exam is not consistent with cauda equina or other acute spine emergency.  Patient has no tenderness to palpation of the testicles.  No scrotal cellulitis.  No clinical findings to strongly suspect testicular torsion causing pain although this was considered.  Reevaluation with update and discussion with patient.  He is feeling improved on reassessment after pain management in the ED.  I consulted the PDMP and will send home with a short prescription for pain medication with plan for close PCP and spine surgery follow-up.  He is already an [facility] patient in the [facility] system according to the patient.   Considered admission but pain is well-controlled and workup here is largely reassuring.  Patient's presentation is most consistent with acute presentation with potential threat to life or bodily function.   Disposition: discharge  ____________________________________________  FINAL CLINICAL IMPRESSION(S) / ED DIAGNOSES  Final diagnoses:  Acute left-sided low back pain with left-sided sciatica     NEW OUTPATIENT MEDICATIONS STARTED DURING THIS VISIT:  Discharge Medication List as of 04/10/2022  3:58 AM     START taking these medications   Details  lidocaine (XYLOCAINE) 5 % ointment Apply 1 Application topically as needed., Starting Thu 04/10/2022, Normal    oxyCODONE-acetaminophen (PERCOCET/ROXICET) 5-325 MG tablet Take 1 tablet by mouth every 6 (six) hours as  needed for severe pain., Starting Thu 04/10/2022, Normal    senna-docusate (SENOKOT-S) 8.6-50 MG tablet Take 1 tablet by mouth at bedtime as needed for mild constipation or moderate constipation., Starting Thu 04/10/2022, Normal        Note:  This document was prepared using Dragon voice recognition software and may include unintentional dictation errors.  Alona Bene, MD, [facility]    Francisco Rivera, Arlyss Repress, MD 04/11/22 [id number]

## 2022-04-10 NOTE — ED Notes (Addendum)
Patient transported to CT 

## 2022-04-10 NOTE — Discharge Instructions (Signed)

## 2022-04-11 LAB — URINE CULTURE: Culture: NO GROWTH
